# Patient Record
Sex: Male | Born: 1975 | Race: White | Hispanic: No | Marital: Single | Smoking: Never smoker
Health system: Southern US, Community
[De-identification: ages and names within clinical notes are randomized; demographics above are authoritative.]

## PROBLEM LIST (undated history)

## (undated) DIAGNOSIS — K859 Acute pancreatitis without necrosis or infection, unspecified: Secondary | ICD-10-CM

## (undated) DIAGNOSIS — F419 Anxiety disorder, unspecified: Secondary | ICD-10-CM

## (undated) DIAGNOSIS — R5600 Simple febrile convulsions: Secondary | ICD-10-CM

## (undated) DIAGNOSIS — A0472 Enterocolitis due to Clostridium difficile, not specified as recurrent: Secondary | ICD-10-CM

## (undated) HISTORY — PX: TONSILLECTOMY AND ADENOIDECTOMY: SHX28

## (undated) HISTORY — PX: CHOLECYSTECTOMY: SHX55

## (undated) HISTORY — PX: OTHER SURGICAL HISTORY: SHX169

---

## 2014-10-22 ENCOUNTER — Emergency Department (HOSPITAL_COMMUNITY): Payer: BC Managed Care – PPO

## 2014-10-22 ENCOUNTER — Emergency Department (HOSPITAL_COMMUNITY)
Admission: EM | Admit: 2014-10-22 | Discharge: 2014-10-23 | Disposition: A | Discharge status: Home / Self Care | Payer: BC Managed Care – PPO | Attending: Emergency Medicine | Admitting: Emergency Medicine

## 2014-10-22 ENCOUNTER — Encounter (HOSPITAL_COMMUNITY): Payer: Self-pay | Admitting: *Deleted

## 2014-10-22 DIAGNOSIS — F1410 Cocaine abuse, uncomplicated: Secondary | ICD-10-CM | POA: Insufficient documentation

## 2014-10-22 DIAGNOSIS — F419 Anxiety disorder, unspecified: Secondary | ICD-10-CM | POA: Insufficient documentation

## 2014-10-22 DIAGNOSIS — R1010 Upper abdominal pain, unspecified: Secondary | ICD-10-CM | POA: Insufficient documentation

## 2014-10-22 DIAGNOSIS — Z79899 Other long term (current) drug therapy: Secondary | ICD-10-CM | POA: Diagnosis not present

## 2014-10-22 DIAGNOSIS — R1030 Lower abdominal pain, unspecified: Secondary | ICD-10-CM | POA: Diagnosis not present

## 2014-10-22 DIAGNOSIS — R109 Unspecified abdominal pain: Secondary | ICD-10-CM

## 2014-10-22 DIAGNOSIS — R110 Nausea: Secondary | ICD-10-CM | POA: Insufficient documentation

## 2014-10-22 DIAGNOSIS — F329 Major depressive disorder, single episode, unspecified: Secondary | ICD-10-CM

## 2014-10-22 DIAGNOSIS — F32A Depression, unspecified: Secondary | ICD-10-CM

## 2014-10-22 DIAGNOSIS — Z9049 Acquired absence of other specified parts of digestive tract: Secondary | ICD-10-CM | POA: Diagnosis not present

## 2014-10-22 HISTORY — DX: Anxiety disorder, unspecified: F41.9

## 2014-10-22 HISTORY — DX: Simple febrile convulsions: R56.00

## 2014-10-22 LAB — CBC WITH DIFFERENTIAL/PLATELET
BASOS ABS: 0 10*3/uL (ref 0.0–0.1)
BASOS PCT: 1 %
EOS ABS: 0.2 10*3/uL (ref 0.0–0.7)
Eosinophils Relative: 3 %
HCT: 46.4 % (ref 39.0–52.0)
HEMOGLOBIN: 16 g/dL (ref 13.0–17.0)
Lymphocytes Relative: 24 %
Lymphs Abs: 1.4 10*3/uL (ref 0.7–4.0)
MCH: 30.2 pg (ref 26.0–34.0)
MCHC: 34.5 g/dL (ref 30.0–36.0)
MCV: 87.5 fL (ref 78.0–100.0)
MONOS PCT: 10 %
Monocytes Absolute: 0.6 10*3/uL (ref 0.1–1.0)
NEUTROS PCT: 62 %
Neutro Abs: 3.8 10*3/uL (ref 1.7–7.7)
Platelets: 255 10*3/uL (ref 150–400)
RBC: 5.3 MIL/uL (ref 4.22–5.81)
RDW: 13.6 % (ref 11.5–15.5)
WBC: 6 10*3/uL (ref 4.0–10.5)

## 2014-10-22 LAB — URINALYSIS, ROUTINE W REFLEX MICROSCOPIC
Bilirubin Urine: NEGATIVE
Glucose, UA: NEGATIVE mg/dL
Ketones, ur: 40 mg/dL — AB
Leukocytes, UA: NEGATIVE
Nitrite: NEGATIVE
PROTEIN: NEGATIVE mg/dL
Specific Gravity, Urine: <1.005 — ABNORMAL LOW (ref 1.005–1.030)
UROBILINOGEN UA: 0.2 mg/dL (ref 0.0–1.0)
pH: 5.5 (ref 5.0–8.0)

## 2014-10-22 LAB — RAPID URINE DRUG SCREEN, HOSP PERFORMED
AMPHETAMINES: NOT DETECTED
Barbiturates: NOT DETECTED
Benzodiazepines: NOT DETECTED
Cocaine: NOT DETECTED
OPIATES: POSITIVE — AB
Tetrahydrocannabinol: NOT DETECTED

## 2014-10-22 LAB — COMPREHENSIVE METABOLIC PANEL
ALT: 21 U/L (ref 17–63)
AST: 14 U/L — ABNORMAL LOW (ref 15–41)
Albumin: 4.4 g/dL (ref 3.5–5.0)
Alkaline Phosphatase: 81 U/L (ref 38–126)
Anion gap: 9 (ref 5–15)
BUN: 10 mg/dL (ref 6–20)
CHLORIDE: 104 mmol/L (ref 101–111)
CO2: 28 mmol/L (ref 22–32)
Calcium: 9.3 mg/dL (ref 8.9–10.3)
Creatinine, Ser: 0.88 mg/dL (ref 0.61–1.24)
Glucose, Bld: 85 mg/dL (ref 65–99)
POTASSIUM: 4 mmol/L (ref 3.5–5.1)
SODIUM: 141 mmol/L (ref 135–145)
Total Bilirubin: 0.9 mg/dL (ref 0.3–1.2)
Total Protein: 7 g/dL (ref 6.5–8.1)

## 2014-10-22 LAB — URINE MICROSCOPIC-ADD ON

## 2014-10-22 LAB — LACTIC ACID, PLASMA
LACTIC ACID, VENOUS: 0.8 mmol/L (ref 0.5–2.0)
LACTIC ACID, VENOUS: 1 mmol/L (ref 0.5–2.0)

## 2014-10-22 LAB — LIPASE, BLOOD: LIPASE: 34 U/L (ref 22–51)

## 2014-10-22 MED ORDER — IOHEXOL 300 MG/ML  SOLN
100.0000 mL | Freq: Once | INTRAMUSCULAR | Status: AC | PRN
  Administered 2014-10-22: 100 mL via INTRAVENOUS

## 2014-10-22 MED ORDER — IOHEXOL 300 MG/ML  SOLN
25.0000 mL | Freq: Once | INTRAMUSCULAR | Status: AC | PRN
  Administered 2014-10-22: 25 mL via INTRAVENOUS

## 2014-10-22 MED ORDER — SODIUM CHLORIDE 0.9 % IV SOLN
INTRAVENOUS | Status: DC
  Administered 2014-10-22: 19:00:00 via INTRAVENOUS

## 2014-10-22 MED ORDER — PROMETHAZINE HCL 25 MG PO TABS: 25.0000 mg | ORAL_TABLET | Freq: Four times a day (QID) | ORAL | Status: DC | PRN

## 2014-10-22 MED ORDER — ONDANSETRON HCL 4 MG/2ML IJ SOLN
4.0000 mg | INTRAMUSCULAR | Status: DC | PRN
  Administered 2014-10-22: 4 mg via INTRAVENOUS
  Filled 2014-10-22: qty 2

## 2014-10-22 MED ORDER — MORPHINE SULFATE (PF) 4 MG/ML IV SOLN
4.0000 mg | INTRAVENOUS | Status: DC | PRN
  Administered 2014-10-22: 4 mg via INTRAVENOUS
  Filled 2014-10-22: qty 1

## 2014-10-22 NOTE — ED Notes (Signed)
MD at bedside. 
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
## 2014-10-22 NOTE — ED Notes (Signed)
Consent form for medical records signed and to be faxed via Diplomatic Services operational officer.

## 2014-10-22 NOTE — Discharge Instructions (Signed)
°Emergency Department Resource Guide °1) Find a Doctor and Pay Out of Pocket °Although you won't have to find out who is covered by your insurance plan, it is a good idea to ask around and get recommendations. You will then need to call the office and see if the doctor you have chosen will accept you as a new patient and what types of options they offer for patients who are self-pay. Some doctors offer discounts or will set up payment plans for their patients who do not have insurance, but you will need to ask so you aren't surprised when you get to your appointment. ° °2) Contact Your Local Health Department °Not all health departments have doctors that can see patients for sick visits, but many do, so it is worth a call to see if yours does. If you don't know where your local health department is, you can check in your phone book. The CDC also has a tool to help you locate your state's health department, and many state websites also have listings of all of their local health departments. ° °3) Find a Walk-in Clinic °If your illness is not likely to be very severe or complicated, you may want to try a walk in clinic. These are popping up all over the country in pharmacies, drugstores, and shopping centers. They're usually staffed by nurse practitioners or physician assistants that have been trained to treat common illnesses and complaints. They're usually fairly quick and inexpensive. However, if you have serious medical issues or chronic medical problems, these are probably not your best option. ° °No Primary Care Doctor: °- Call Health Connect at  832-8000 - they can help you locate a primary care doctor that  accepts your insurance, provides certain services, etc. °- Physician Referral Service- 1-800-533-3463 ° °Chronic Pain Problems: °Organization         Address  Phone   Notes  °[facility]  (336) 297-2271 Patients need to be referred by their primary care doctor.  ° °Medication  Assistance: °Organization         Address  Phone   Notes  °[place] [facility] [address] °[place], [place] [address] (336) 641-8030 --Must be a resident of [place] °-- Must have NO insurance coverage whatsoever (no Medicaid/ Medicare, etc.) °-- The pt. MUST have a primary care doctor that directs their care regularly and follows them in the community °  °[facility]  (866) 331-1348   °United Way  (888) 892-1162   ° °Agencies that provide inexpensive medical care: °Organization         Address  Phone   Notes  °[facility]  (336) 832-8035   °[facility]    (336) 832-7272   °[facility] [address] °[place], [place] [address] (336) 832-4777   °[facility] [address], °[place] (336) 271-4999   °[facility]    (336) 373-0678   °[facility]    (336) 272-1050   °[facility] ° [address], [place] Phone:  (336) 832-4444, Fax:  (336) 832-4440 Hours of Operation:  9 am - 6 pm, M-F.  Also accepts Medicaid/Medicare and self-pay.  °[facility] ° [address], [place] Phone: (336) 832-3150, Fax: (336) [phone]. Hours of Operation:  8:30 am - 5:30 pm, M-F.  Also accepts Medicaid and self-pay.  °[facility] [place] [address], [place] Phone: (336) 878-6027   °[facility] [address], [place], [place] (336)723-1848, Ext. 123 Mondays & Thursdays: 7-9 AM.  First 15 patients are seen on a first come, first serve basis. °  ° °Medicaid-accepting [place] Providers: ° °Organization         Address  Phone   Notes  °[facility] [address], [place] (336) 641-2100 Also accepts self-pay patients.  °[facility] [address], [place] ° (336) 856-9996   °[facility] [address], [place]  (336) 288-8857   °[facility] [address] [place] [address], [place] (336) 299-7000   °Veita Bland [address], [place]  ° (336) 373-1557 Only accepts [place] Access Medicaid patients after they have their name applied to their card.  ° °Self-Pay (no insurance) in [place]: ° °Organization         Address  Phone   Notes  °Sickle Cell Patients, [facility] [address], [place] (336) 832-1970   °[facility] [address], [place] (336) 832-4400   °[facility] ° [address], [place] (336) 992-4800   °[facility]/Dr. Osei-Bonsu ° [address] [place] [address], [place] or [address], [place] (336) [phone] Phone number for both [place] and [place] locations is the same.  °[facility] [address], [place] (336) 299-0000   °[facility] [address] [place] [address], [place] or [address] (336) 852-7530 °(336) 878-2260   °[facility] [address], [place] (336) 350-1642, phone; (336) 294-5005, fax Sees patients 1st and 3rd Saturday of every month.  Must not qualify for public or private insurance (i.e. Medicaid, Medicare, [place] Health Choice, Veterans' Benefits) • Household income should be no more than 200% of the poverty level •The clinic cannot treat you if you are pregnant or think you are pregnant • Sexually transmitted diseases are not treated at the clinic.  ° ° °Dental Care: °Organization         Address  Phone  Notes  °[place] [facility] [address], [place] (336) 641-6152 Accepts children up to age 21 who are enrolled in Medicaid or [place] Health Choice; pregnant women with a Medicaid card; and children who have applied for Medicaid or [place] Health Choice, but were declined, whose parents can pay a reduced fee at time of service.  °[place]  [facility] [place]  [address], [place] (336) 641-7733 Accepts children up to age 21 who are enrolled in Medicaid or [place] Health Choice; pregnant women with a Medicaid card; and children who have applied for Medicaid or [place] Health Choice, but were declined, whose parents can pay a reduced fee at time of service.  °[facility] ° [address], [place] (336) 641-4533 Patients are seen by appointment only. Walk-ins are not accepted. [facility] will see patients 18 years of age and older. °Monday - Tuesday (8am-5pm) °Most Wednesdays (8:30-5pm) °$30 per visit, cash only  °[facility] ° [address], [place] (336) 641-4533 Patients are seen by appointment only. Walk-ins are not accepted. [facility] will see patients 18 years of age and older. °One   Wednesday Evening (Monthly: Volunteer Based).  $30 per visit, cash only  °[facility]  (919) 537-3737 for adults; Children under age 4, call Graduate Pediatric Dentistry at (919) [phone]. Children aged 4-14, please call (919) [phone] to request a pediatric application. ° Dental services are provided in all areas of dental care including fillings, crowns and bridges, complete and partial dentures, implants, gum treatment, root canals, and extractions. Preventive care is also provided. Treatment is provided to both adults and children. °Patients are selected via a lottery and there is often a waiting list. °  °[facility] [address], °[place] ° (336) 763-8833 [email or web address] °  °[facility] [address], [place], [place] (336)723-1848, Ext. 123 Second and Fourth Thursday of each month, opens at 6:30 AM; Clinic ends at 9 AM.  Patients are seen on a first-come first-served basis, and a limited number are seen during each clinic.  ° °[facility] ° [address], [place], [place] (336) 723-7904    Eligibility Requirements °You must have lived in [place], [place], or [place] counties for at least the last three months. °  You cannot be eligible for state or federal sponsored healthcare insurance, including Veterans Administration, Medicaid, or Medicare. °  You generally cannot be eligible for healthcare insurance through your employer.  °  How to apply: °Eligibility screenings are held every Tuesday and Wednesday afternoon from 1:00 pm until 4:00 pm. You do not need an appointment for the interview!  °[facility] [address], [place], [place] 336-631-2330   °[facility]  336-342-8273   °[place]  336-703-3100   °[facility]  336-570-6415   ° °Behavioral Health Resources in the Community: °Intensive Outpatient Programs °Organization         Address  Phone  Notes  °[facility] [address], [place], [place] 336-878-6098   °[facility] [address], [place], [place] 336-832-9800   °[facility] [address], [place], [place] ° 336-882-2125   °[facility] [address],  °[place], [place] 1-800-853-5163 or 336-641-4981   °Substance Abuse Resources °Organization         Address  Phone  Notes  °[facility]  336-882-2125   °[facility]  336-784-9470   °[facility]  336-285-9073   °[facility]  336-845-3988   °Residential & Outpatient Substance Abuse Program  1-800-659-3381   °Psychological Services °Organization         Address  Phone  Notes  °[facility]  336- 832-9600   °[facility]  336- 378-7881   °[facility] [address], [place] 1-800-853-5163 or 336-641-4981   ° °Mobile Crisis Teams °Organization         Address  Phone  Notes  °[facility], Mobile Crisis Care Unit  1-877-626-1772   °Assertive °[facility] ° [address]  [place], [place] 336-834-9664   °Sharon DeEsch [address] °[place] 336-554-5454   ° °Self-Help/Support Groups °Organization         Address  Phone             Notes  °[facility]oc[facility] of  - variety of support groups  336- 373-1402 Call for more information  °Narcotics Anonymous (NA), [facility] [address], °[place]  2 meetings at this location  ° °  Residential Treatment Programs Organization         Address  Phone  Notes  [facility] [address],    [place]  1-610-960-4540   [facility]  [address] [place] [address], [place], [place] 478-295-6213   [facility] [address] [place], [place] 086-578-4696 Admissions: 8am-3pm M-F  [facility] [address],    [place], [place] 295-284-1324   [facility] [address] [place], [place], [place] 401-027-2536   [facility] [address]  [place], [place] 644-034-7425   [facility] Intensive Outpatient [address], [place], [place] 956-387-5643   [facility] ([facility] [address] [place]  [place], [place] 3-295-188-4166 or 973-396-7491   [facility] ([facility] [address] [place], [place] 323-557-3220 Accepts Medicaid  [facility] [address],  [place] 2-542-706-2376 Substance Abuse/Addiction Treatment   [facility] Organization         Address  Phone  Notes  [facility]  6414724688   Angie Fava, PhD [address] [place], [place]   8587532279 or (272)169-3314   [facility]   [address] [place], [place] 9303339918   [facility] [address], [place], [place] (938) 596-4895 Insurance/Medicaid/sponsorship through [facility] and Families [address]                                    [place], [place] [phone] Therapy/tele-psych/case    [facility] [address], [place] 647-735-4718    Dr. Lolly Mustache  219-467-1832   [facility]  [employer] [facility] Dept. 1) [address] [place] 2) [address], [place] 3)  [address], [place] [phone] (901)[phone]  551-434-6805   [facility] Child Abuse Hotline 754 478 4663 or 340-178-6359 (After Hours)      Take the prescription as directed.  Call your regular medical doctor tomorrow to schedule a follow up appointment within the next 2 days. Call the mental health resources given to you today to schedule a follow up appointment within the next week.   Return to the Emergency Department immediately sooner if worsening.

## 2014-10-22 NOTE — ED Notes (Signed)
Pt and visitor at bedside updated on plan of care, denies any complaints,

## 2014-10-22 NOTE — ED Notes (Signed)
Patient reports lots of health problems lately. Cholecystectomy that lead to pancreatitis and C.Diff. Reports lost 60 pounds  since July. Reports pain in right side under ribs and also anxiety.

## 2014-10-22 NOTE — ED Notes (Signed)
RN spoke with Corrie Dandy at [facility] who advised that pt did not meet inpatient criteria, would fax outpatient resources to [facility] for pt,  Dr Clarene Duke notified,

## 2014-10-22 NOTE — ED Provider Notes (Signed)
CSN: [id number]     Arrival date & time 10/22/14  1737 History   First MD Initiated Contact with Patient 10/22/14 1753     Chief Complaint  Patient presents with  . abd pain; anxiety       HPI  Pt was seen at 1755. Per pt and his family, c/o gradual onset and persistence of constant abd "pain" and nausea for the past 2 months. Pt is s/p cholecystectomy last month at [facility]. Pt states after he was discharged post-surgery, he developed acute pancreatitis and then cdiff diarrhea. Pt states he continues to take vancomycin, but not as prescribed by his PMD (pt is taking once/day vs four times/day). Pt states he changed the dosing of his vancomycin because his diarrhea improved. Pt states he has been "anxious" for the past 2 weeks, with decreased PO intake. Pt states his PMD told him this past week that "it's like I have PTSD after all I've been through" and started him on effexor. The symptoms have been associated with no other complaints. The patient has a significant history of similar symptoms previously, recently being evaluated for this complaint and multiple prior evals for same.  Pt states he was evaluated at [facility] 4 days ago for these complaints and dx anxiety. Denies fevers, no rash, no CP/SOB, no cough, no back pain, no diarrhea, no vomiting. Denies SI, no HI, no hallucinations.     Past Medical History  Diagnosis Date  . Anxiety   . Febrile seizure, simple (HCC)     as child   Past Surgical History  Procedure Laterality Date  . Cholestectomy    . Tonsillectomy and adenoidectomy    . Cholecystectomy      Social History  Substance Use Topics  . Smoking status: Never Smoker   . Smokeless tobacco: None  . Alcohol Use: No    Review of Systems ROS: Statement: All systems negative except as marked or noted in the HPI; Constitutional: Negative for fever and chills. ; ; Eyes: Negative for eye pain, redness and discharge. ; ; ENMT: Negative for ear pain,  hoarseness, nasal congestion, sinus pressure and sore throat. ; ; Cardiovascular: Negative for chest pain, palpitations, diaphoresis, dyspnea and peripheral edema. ; ; Respiratory: Negative for cough, wheezing and stridor. ; ; Gastrointestinal: +nausea, abd pain. Negative for vomiting, diarrhea, blood in stool, hematemesis, jaundice and rectal bleeding. . ; ; Genitourinary: Negative for dysuria, flank pain and hematuria. ; ; Musculoskeletal: Negative for back pain and neck pain. Negative for swelling and trauma.; ; Skin: Negative for pruritus, rash, abrasions, blisters, bruising and skin lesion.; ; Neuro: Negative for headache, lightheadedness and neck stiffness. Negative for weakness, altered level of consciousness , altered mental status, extremity weakness, paresthesias, involuntary movement, seizure and syncope. ; Psych:  +anxiety. No SI, no SA, no HI, no hallucinations.    Allergies  Review of patient's allergies indicates no known allergies.  Home Medications   Prior to Admission medications   Medication Sig Start Date End Date Taking? Authorizing Provider  cholestyramine (QUESTRAN) 4 G packet USE 1 PACKET THREE TIMES DAILY AS NEEDED FOR DIARRHEA 09/20/14   Historical Provider, MD  clonazePAM (KLONOPIN) 1 MG tablet Take 1 mg by mouth 2 (two) times daily. 10/18/14   Historical Provider, MD  [employer] SENNA PLUS 8.6-50 MG tablet Take 2 tablets by mouth daily. 08/28/14   Historical Provider, MD  vancomycin (VANCOCIN) 125 MG capsule Take 125 mg by mouth daily. 09/20/14   Historical Provider,  MD  venlafaxine XR (EFFEXOR-XR) 37.5 MG 24 hr capsule Take 37.5 mg by mouth daily. 10/15/14   Historical Provider, MD   BP 123/90 mmHg  Pulse 85  Temp(Src) 97.8 F (36.6 C) (Oral)  Resp 18  Ht 6\' 1"  (1.854 m)  Wt 175 lb (79.379 kg)  BMI 23.09 kg/m2  SpO2 100% Physical Exam  1755: Physical examination:  Nursing notes reviewed; Vital signs and O2 SAT reviewed;  Constitutional: Well developed, Well nourished, Well  hydrated, In no acute distress; Head:  Normocephalic, atraumatic; Eyes: EOMI, PERRL, No scleral icterus; ENMT: Mouth and pharynx normal, Mucous membranes moist; Neck: Supple, Full range of motion, No lymphadenopathy; Cardiovascular: Regular rate and rhythm, No murmur, rub, or gallop; Respiratory: Breath sounds clear & equal bilaterally, No rales, rhonchi, wheezes.  Speaking full sentences with ease, Normal respiratory effort/excursion; Chest: Nontender, Movement normal; Abdomen: Soft, +upper>lower abd tenderness to palp, no rebound or guarding. Nondistended, Normal bowel sounds; Genitourinary: No CVA tenderness; Extremities: Pulses normal, No tenderness, No edema, No calf edema or asymmetry.; Neuro: AA&Ox3, Major CN grossly intact.  Speech clear. No gross focal motor or sensory deficits in extremities.; Skin: Color normal, Warm, Dry.; Psych:  Anxious.   ED Course  Procedures (including critical care time) Labs Review   Imaging Review  I have personally reviewed and evaluated these images and lab results as part of my medical decision-making.   EKG Interpretation None      MDM  MDM Reviewed: nursing note and vitals Interpretation: labs, x-ray and CT scan      Results for orders placed or performed during the hospital encounter of 10/22/14  Comprehensive metabolic panel  Result Value Ref Range   Sodium 141 135 - 145 mmol/L   Potassium 4.0 3.5 - 5.1 mmol/L   Chloride 104 101 - 111 mmol/L   CO2 28 22 - 32 mmol/L   Glucose, Bld 85 65 - 99 mg/dL   BUN 10 6 - 20 mg/dL   Creatinine, Ser 1.61 0.61 - 1.24 mg/dL   Calcium 9.3 8.9 - 09.6 mg/dL   Total Protein 7.0 6.5 - 8.1 g/dL   Albumin 4.4 3.5 - 5.0 g/dL   AST 14 (L) 15 - 41 U/L   ALT 21 17 - 63 U/L   Alkaline Phosphatase 81 38 - 126 U/L   Total Bilirubin 0.9 0.3 - 1.2 mg/dL   GFR calc non Af Amer >60 >60 mL/min   GFR calc Af Amer >60 >60 mL/min   Anion gap 9 5 - 15  Lipase, blood  Result Value Ref Range   Lipase 34 22 - 51 U/L   Lactic acid, plasma  Result Value Ref Range   Lactic Acid, Venous 0.8 0.5 - 2.0 mmol/L  Lactic acid, plasma  Result Value Ref Range   Lactic Acid, Venous 1.0 0.5 - 2.0 mmol/L  CBC with Differential  Result Value Ref Range   WBC 6.0 4.0 - 10.5 K/uL   RBC 5.30 4.22 - 5.81 MIL/uL   Hemoglobin 16.0 13.0 - 17.0 g/dL   HCT 04.5 40.9 - 81.1 %   MCV 87.5 78.0 - 100.0 fL   MCH 30.2 26.0 - 34.0 pg   MCHC 34.5 30.0 - 36.0 g/dL   RDW 91.4 78.2 - 95.6 %   Platelets 255 150 - 400 K/uL   Neutrophils Relative % 62 %   Neutro Abs 3.8 1.7 - 7.7 K/uL   Lymphocytes Relative 24 %   Lymphs Abs 1.4 0.7 - 4.0  K/uL   Monocytes Relative 10 %   Monocytes Absolute 0.6 0.1 - 1.0 K/uL   Eosinophils Relative 3 %   Eosinophils Absolute 0.2 0.0 - 0.7 K/uL   Basophils Relative 1 %   Basophils Absolute 0.0 0.0 - 0.1 K/uL  Urinalysis, Routine w reflex microscopic  Result Value Ref Range   Color, Urine YELLOW YELLOW   APPearance CLEAR CLEAR   Specific Gravity, Urine <1.005 (L) 1.005 - 1.030   pH 5.5 5.0 - 8.0   Glucose, UA NEGATIVE NEGATIVE mg/dL   Hgb urine dipstick TRACE (A) NEGATIVE   Bilirubin Urine NEGATIVE NEGATIVE   Ketones, ur 40 (A) NEGATIVE mg/dL   Protein, ur NEGATIVE NEGATIVE mg/dL   Urobilinogen, UA 0.2 0.0 - 1.0 mg/dL   Nitrite NEGATIVE NEGATIVE   Leukocytes, UA NEGATIVE NEGATIVE  Urine rapid drug screen (hosp performed)  Result Value Ref Range   Opiates POSITIVE (A) NONE DETECTED   Cocaine NONE DETECTED NONE DETECTED   Benzodiazepines NONE DETECTED NONE DETECTED   Amphetamines NONE DETECTED NONE DETECTED   Tetrahydrocannabinol NONE DETECTED NONE DETECTED   Barbiturates NONE DETECTED NONE DETECTED  Urine microscopic-add on  Result Value Ref Range   Squamous Epithelial / LPF RARE RARE   WBC, UA 0-2 <3 WBC/hpf   RBC / HPF 0-2 <3 RBC/hpf   Bacteria, UA RARE RARE   Dg Chest 2 View 10/22/2014   CLINICAL DATA:  Right-sided subcostal pain. Significant weight loss.  EXAM: CHEST  2 VIEW   COMPARISON:  None.  FINDINGS: The heart size and mediastinal contours are within normal limits. Both lungs are clear. The visualized skeletal structures are unremarkable.  IMPRESSION: No active cardiopulmonary disease.   Electronically Signed   By: Ellery Plunk M.D.   On: 10/22/2014 19:15   Ct Abdomen Pelvis W Contrast 10/22/2014   CLINICAL DATA:  Right abdominal pain  EXAM: CT ABDOMEN AND PELVIS WITH CONTRAST  TECHNIQUE: Multidetector CT imaging of the abdomen and pelvis was performed using the standard protocol following bolus administration of intravenous contrast.  CONTRAST:  25mL OMNIPAQUE IOHEXOL 300 MG/ML SOLN, OMNIPAQUE IOHEXOL 300 MG/ML SOLN  COMPARISON:  None.  FINDINGS: Tiny hypodensities in the liver have a benign appearance  Postcholecystectomy  Pancreas, spleen, adrenal glands, and kidneys are within normal limits.  Normal appendix.  Bladder and prostate are within normal limits.  No vertebral compression deformity.  No free-fluid.  No abnormal retroperitoneal adenopathy.  IMPRESSION: Tiny hypodensities in the liver are statistically benign. If the patient has a history of malignancy or has risk factors for malignancy, consider six-month follow-up liver MRI. Otherwise, no acute intra-abdominal pathology.   Electronically Signed   By: Jolaine Click M.D.   On: 10/22/2014 19:12    2010:  Workup reassuring. Pt has tol PO well while in the ED without N/V. Two of pt's co-workers/friends are now at the bedside and requested to speak to me. Pt gave them permission. They state to me that they are concerned regarding pt's "mental health declining over the past several months" and "do not believe he is safe to go home tonight."  Pt expressly denies SI/HI to me and his ED RN to direct questioning, and pt is not demonstrating active psychosis. Pt's friends are both insistent that pt "just can't go home like this" and are starting to make inquires regarding [facility] executive administration  (titles, location, availability, etc) and "wanting something done." Will obtain TTS consult.   2345:  TTS has completed  eval: states there is no indication for admission at this time, d/c pt with outpt resources. Pt continues to deny SI and would like to go home now. Pt's co-worker has now apologized to me for his earlier behavior and is appreciative for the extensive evaluation of the pt. Dx and testing d/w pt.  Questions answered.  Verb understanding, agreeable to d/c home with outpt f/u.     Samuel Jester, DO 10/26/14 [phone]

## 2014-10-26 ENCOUNTER — Encounter (HOSPITAL_COMMUNITY): Payer: Self-pay | Admitting: *Deleted

## 2014-10-26 ENCOUNTER — Emergency Department (HOSPITAL_COMMUNITY)
Admission: EM | Admit: 2014-10-26 | Discharge: 2014-10-26 | Disposition: A | Discharge status: Home / Self Care | Payer: BC Managed Care – PPO | Attending: Emergency Medicine | Admitting: Emergency Medicine

## 2014-10-26 DIAGNOSIS — F329 Major depressive disorder, single episode, unspecified: Secondary | ICD-10-CM | POA: Diagnosis not present

## 2014-10-26 DIAGNOSIS — F419 Anxiety disorder, unspecified: Secondary | ICD-10-CM | POA: Diagnosis not present

## 2014-10-26 DIAGNOSIS — Z79899 Other long term (current) drug therapy: Secondary | ICD-10-CM | POA: Insufficient documentation

## 2014-10-26 DIAGNOSIS — F32A Depression, unspecified: Secondary | ICD-10-CM
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
## 2014-10-26 NOTE — ED Provider Notes (Signed)
CSN: [id number]     Arrival date & time 10/26/14  1901 History   First MD Initiated Contact with Patient 10/26/14 1941     Chief Complaint  Patient presents with  . Anxiety     (Consider location/radiation/quality/duration/timing/severity/associated sxs/prior Treatment) HPI Comments: Patient is a 39 year old male who presents with complaints of anxiety and depression. He reports multiple things going on in his life that have caused him to feel this way. He recently had a falling out with a significant other and reports a lack of a support system. He also recently underwent gallbladder surgery which she reports has not subsided the abdominal discomfort he is reporting. He moved to the area approximately one year ago and currently works as a Financial planner at [employer]. He was seen here 3 days ago and had extensive workup performed including CT of the abdomen, laboratory studies, and TTS consultation. He reports to me that he feels "lost without a map". He denies any suicidal or homicidal ideation. He denies any auditory or visual hallucinations.  Patient is a 39 y.o. male presenting with anxiety. The history is provided by the patient.  Anxiety This is a new problem. The current episode started more than 1 week ago. The problem occurs constantly. The problem has been gradually worsening. Associated symptoms include abdominal pain. Pertinent negatives include no chest pain. Nothing aggravates the symptoms. Nothing relieves the symptoms. He has tried nothing for the symptoms. The treatment provided no relief.    Past Medical History  Diagnosis Date  . Anxiety   . Febrile seizure, simple (HCC)     as child   Past Surgical History  Procedure Laterality Date  . Cholestectomy    . Tonsillectomy and adenoidectomy    . Cholecystectomy     History reviewed. No pertinent family history. Social History  Substance Use Topics  . Smoking status: Never Smoker   . Smokeless tobacco: None  . Alcohol  Use: No    Review of Systems  Cardiovascular: Negative for chest pain.  Gastrointestinal: Positive for abdominal pain.  All other systems reviewed and are negative.     Allergies  Review of patient's allergies indicates no known allergies.  Home Medications   Prior to Admission medications   Medication Sig Start Date End Date Taking? Authorizing Provider  clonazePAM (KLONOPIN) 1 MG tablet Take 1 mg by mouth 2 (two) times daily. 10/18/14   Historical Provider, MD  promethazine (PHENERGAN) 25 MG tablet Take 1 tablet (25 mg total) by mouth every 6 (six) hours as needed for nausea or vomiting. 10/22/14   Samuel Jester, DO  vancomycin (VANCOCIN) 125 MG capsule Take 125 mg by mouth daily. 09/20/14   Historical Provider, MD  venlafaxine XR (EFFEXOR-XR) 37.5 MG 24 hr capsule Take 37.5 mg by mouth daily. 10/15/14   Historical Provider, MD   BP 145/114 mmHg  Pulse 107  Temp(Src) 98.2 F (36.8 C) (Oral)  Resp 20  Ht  (1.854 m)  Wt 175 lb (79.379 kg)  BMI 23.09 kg/m2  SpO2 97% Physical Exam  Constitutional: He is oriented to person, place, and time. He appears well-developed and well-nourished. No distress.  HENT:  Head: Normocephalic and atraumatic.  Mouth/Throat: Oropharynx is clear and moist.  Eyes: EOM are normal. Pupils are equal, round, and reactive to light.  Neck: Normal range of motion. Neck supple.  Cardiovascular: Normal rate, regular rhythm and normal heart sounds.   No murmur heard. Pulmonary/Chest: Effort normal and breath sounds normal. No respiratory  distress. He has no wheezes.  Abdominal: Soft. Bowel sounds are normal. He exhibits no distension. There is no tenderness.  Musculoskeletal: Normal range of motion. He exhibits no edema.  Lymphadenopathy:    He has no cervical adenopathy.  Neurological: He is alert and oriented to person, place, and time.  Skin: Skin is warm and dry. He is not diaphoretic.  Psychiatric: His speech is normal. Judgment normal. He is  withdrawn. Thought content is not paranoid and not delusional. Cognition and memory are normal. He exhibits a depressed mood. He expresses no homicidal and no suicidal ideation. He expresses no suicidal plans and no homicidal plans.  Nursing note and vitals reviewed.   ED Course  Procedures (including critical care time) Labs Review Labs Reviewed - No data to display  Imaging Review No results found. I have personally reviewed and evaluated these images and lab results as part of my medical decision-making.   EKG Interpretation None      MDM   Final diagnoses:  None    Patient was evaluated by TTS and they do not feel as though he meets inpatient criteria. He will be discharged to home with instructions to follow-up with outpatient counseling. He will be called tomorrow to make this arrangement.    Geoffery Lyons, MD 10/26/14 [phone]

## 2014-10-26 NOTE — ED Notes (Addendum)
Pt reporting generalized abdominal discomfort. Pt states he has a great deal of anxiety and stress.  Reporting feelings of depression, but denies SI/HI.

## 2014-10-26 NOTE — Discharge Instructions (Signed)
TTS will call you to arrange an outpatient counseling appointment.   Major Depressive Disorder Major depressive disorder is a mental illness. It also may be called clinical depression or unipolar depression. Major depressive disorder usually causes feelings of sadness, hopelessness, or helplessness. Some people with this disorder do not feel particularly sad but lose interest in doing things they used to enjoy (anhedonia). Major depressive disorder also can cause physical symptoms. It can interfere with work, school, relationships, and other normal everyday activities. The disorder varies in severity but is longer lasting and more serious than the sadness we all feel from time to time in our lives. Major depressive disorder often is triggered by stressful life events or major life changes. Examples of these triggers include divorce, loss of your job or home, a move, and the death of a family member or close friend. Sometimes this disorder occurs for no obvious reason at all. People who have family members with major depressive disorder or bipolar disorder are at higher risk for developing this disorder, with or without life stressors. Major depressive disorder can occur at any age. It may occur just once in your life (single episode major depressive disorder). It may occur multiple times (recurrent major depressive disorder). SYMPTOMS People with major depressive disorder have either anhedonia or depressed mood on nearly a daily basis for at least 2 weeks or longer. Symptoms of depressed mood include:  Feelings of sadness (blue or down in the dumps) or emptiness.  Feelings of hopelessness or helplessness.  Tearfulness or episodes of crying (may be observed by others).  Irritability (children and adolescents). In addition to depressed mood or anhedonia or both, people with this disorder have at least four of the following symptoms:  Difficulty sleeping or sleeping too much.   Significant change  (increase or decrease) in appetite or weight.   Lack of energy or motivation.  Feelings of guilt and worthlessness.   Difficulty concentrating, remembering, or making decisions.  Unusually slow movement (psychomotor retardation) or restlessness (as observed by others).   Recurrent wishes for death, recurrent thoughts of self-harm (suicide), or a suicide attempt. People with major depressive disorder commonly have persistent negative thoughts about themselves, other people, and the world. People with severe major depressive disorder may experiencedistorted beliefs or perceptions about the world (psychotic delusions). They also may see or hear things that are not real (psychotic hallucinations). DIAGNOSIS Major depressive disorder is diagnosed through an assessment by your health care provider. Your health care provider will ask aboutaspects of your daily life, such as mood,sleep, and appetite, to see if you have the diagnostic symptoms of major depressive disorder. Your health care provider may ask about your medical history and use of alcohol or drugs, including prescription medicines. Your health care provider also may do a physical exam and blood work. This is because certain medical conditions and the use of certain substances can cause major depressive disorder-like symptoms (secondary depression). Your health care provider also may refer you to a mental health specialist for further evaluation and treatment. TREATMENT It is important to recognize the symptoms of major depressive disorder and seek treatment. The following treatments can be prescribed for this disorder:   Medicine. Antidepressant medicines usually are prescribed. Antidepressant medicines are thought to correct chemical imbalances in the brain that are commonly associated with major depressive disorder. Other types of medicine may be added if the symptoms do not respond to antidepressant medicines alone or if psychotic  delusions or hallucinations occur.  Talk therapy.  Talk therapy can be helpful in treating major depressive disorder by providing support, education, and guidance. Certain types of talk therapy also can help with negative thinking (cognitive behavioral therapy) and with relationship issues that trigger this disorder (interpersonal therapy). A mental health specialist can help determine which treatment is best for you. Most people with major depressive disorder do well with a combination of medicine and talk therapy. Treatments involving electrical stimulation of the brain can be used in situations with extremely severe symptoms or when medicine and talk therapy do not work over time. These treatments include electroconvulsive therapy, transcranial magnetic stimulation, and vagal nerve stimulation.   This information is not intended to replace advice given to you by your health care provider. Make sure you discuss any questions you have with your health care provider.   Document Released: 04/29/2012 Document Revised: 01/23/2014 Document Reviewed: 04/29/2012 Elsevier Interactive Patient Education [employer]   Emergency Department Resource Guide 1) Find a Doctor and Pay Out of Pocket Although you won't have to find out who is covered by your insurance plan, it is a good idea to ask around and get recommendations. You will then need to call the office and see if the doctor you have chosen will accept you as a new patient and what types of options they offer for patients who are self-pay. Some doctors offer discounts or will set up payment plans for their patients who do not have insurance, but you will need to ask so you aren't surprised when you get to your appointment.  2) Contact Your Local Health Department Not all health departments have doctors that can see patients for sick visits, but many do, so it is worth a call to see if yours does. If you don't know where your local health department  is, you can check in your phone book. The CDC also has a tool to help you locate your state's health department, and many state websites also have listings of all of their local health departments.  3) Find a Walk-in Clinic If your illness is not likely to be very severe or complicated, you may want to try a walk in clinic. These are popping up all over the country in pharmacies, drugstores, and shopping centers. They're usually staffed by nurse practitioners or physician assistants that have been trained to treat common illnesses and complaints. They're usually fairly quick and inexpensive. However, if you have serious medical issues or chronic medical problems, these are probably not your best option.  No Primary Care Doctor: - Call Health Connect at  [phone] - they can help you locate a primary care doctor that  accepts your insurance, provides certain services, etc. - Physician Referral Service- [phone]  Chronic Pain Problems: Organization         Address  Phone   Notes  [facility]  [phone] Patients need to be referred by their primary care doctor.   Medication Assistance: Organization         Address  Phone   Notes  [facility] Medication [facility] [address] [place] [place], [place] [address] [phone] --Must be a resident of [facility] -- Must have NO insurance coverage whatsoever (no Medicaid/ Medicare, etc.) -- The pt. MUST have a primary care doctor that directs their care regularly and follows them in the community   Med[facility]ist  [phone]   [employer]  [phone]    Agencies that provide inexpensive medical care:  Organization         Address  Phone   Notes  [facility]  [phone]   [facility]    [phone]   [facility] [address] [place], [place] [address] [phone]   [facility] [address] [place], [place]  [phone]   [facility]anned Parenthood    [phone]   [facility]    [phone]   [facility]  [address], [place] Phone:  [phone], Fax:  [phone] Hours of Operation:  9 am - 6 pm, M-F.  Also accepts Medicaid/Medicare and self-pay.  [facility]  [address], [place] Phone: [phone], Fax: [phone]. Hours of Operation:  8:30 am - 5:30 pm, M-F.  Also accepts Medicaid and self-pay.  [facility] [place] [address] [place] Phone: [phone]   [facility] [address] Natasha Bence [place], [place] [phone], Ext. [phone] Mondays & Thursdays: 7-9 AM.  First 15 patients are seen on a first come, first serve basis.    Medicaid-accepting [facility] Providers:  Organization         Address  Phone   Notes  [facility] [address], [place] [phone] Also accepts self-pay patients.  [facility] [address] Laurell Josephs [place], [place]  [phone]   [facility] [address], [place] [phone]   [facility] [address], [place] [phone]   Renaye Rakers [address], [place]   [phone] Only accepts [place] patients after they have their name applied to their card.   Self-Pay (no insurance) in [facility]:  Organization         Address  Phone   Notes  Sickle Cell Patients, [facility] [address] [place], [place] [phone]   [facility] [address] [place], [place] [phone]   [facility] [place]  [address], [place] [phone]   [facility]/Dr. Osei-Bonsu  [address], [place] or [address], [place] [phone] Phone number for both [place] and [place]  locations is the same.  [facility] [address] [place] [phone]   [facility] [address] [place] or [address] [phone]   [facility] [address], [employer] [phone], phone; [phone], fax Sees patients 1st and 3rd Saturday of every month.  Must not qualify for public or private insurance (i.e. Medicaid, Medicare, [place] Health Choice, Veterans' Benefits)  Household income should be no more than 200% of the poverty level The clinic cannot treat you if you are pregnant or think you are pregnant  Sexually transmitted diseases are not treated at the clinic.    Dental Care: Organization         Address  Phone  Notes  [facility] Department of [facility] [address] [place], [place] [phone] Accepts children up to age 70 who are enrolled in [place] or [place] Health Choice; pregnant women with a Medicaid card; and children who have applied for Medicaid or [place] Health Choice, but were declined,  whose parents can pay a reduced fee at time of service.  [facility] Department of [facility]  [address], [place] [phone] Accepts children up to age 29 who are enrolled in [place] or [place] Health Choice; pregnant women with a Medicaid card; and children who have applied for Medicaid or [place] Health Choice, but were declined, whose parents can pay a reduced fee at time of service.  [facility]  [address] [place], [place] [phone] Patients are seen by appointment only. Walk-ins are not accepted. [facility] will see patients 35 years of age and older. Monday - Tuesday (8am-5pm) Most Wednesdays (8:30-5pm) $30 per visit, cash only  [facility] Adult [facility] Access [facility]  [address], [facility] [phone] Patients are seen by appointment only. Walk-ins are not accepted. [facility]  will see patients 84 years of age and older. One Wednesday Evening (Monthly: Volunteer Based).  $30 per visit, cash only  [employer]  [phone] for adults; Children under age 15, call Graduate Pediatric Dentistry at [phone]. Children aged 69-14, please call [phone] to request a pediatric application.  Dental services are provided in all areas of dental care including fillings, crowns and bridges, complete and partial dentures, implants, gum treatment, root canals, and extractions. Preventive care is also provided. Treatment is provided to both adults and children. Patients are selected via a lottery and there is often a waiting list.   [facility] [address], [place]  [phone] [email or web address]   [facility] [address] [place], [place] [phone], Ext. [phone] Second and Fourth Thursday of each month, opens at 6:30 AM; Clinic ends at 9 AM.  Patients are seen on a first-come first-served basis, and a limited number are seen during each clinic.   [facility]  [address] Ether Griffins [place], [place] [phone]   Eligibility Requirements You must have lived in [place], [place], or [place] counties for at least the last three months.   You cannot be eligible for state or federal sponsored [place], including [employer], [place], or [employer].   You generally cannot be eligible for healthcare insurance through your employer.    How to apply: Eligibility screenings are held every Tuesday and Wednesday afternoon from 1:00 pm until 4:00 pm. You do not need an appointment for the interview!  [facility] [address], [place], [place] [phone]   [facility] Health [facility]  [phone]   [facility] Health Department  [phone]   [facility] Health Department  [phone]    Behavioral Health Resources in the Community: Intensive Outpatient  Programs Organization         Address  Phone  Notes  [facility] [address] [place], [place] [phone]   [facility] [address] [place], [place] [phone]   [facility] [address] [place], [place]  [phone]   [facility] [address],  [place], [place] [phone] or [phone]   Substance Abuse Resources Organization         Address  Phone  Notes  [facility]  [phone]   [facility]  [phone]   [facility]  [phone]   Floydene Flock  [phone]   Residential & Outpatient Substance Abuse Program  [phone]   Psychological Services Organization  Address  Phone  Notes  [facility]  [phone]   [facility]  [phone]   [facility] [phone], [place] [phone] or [phone]    Mobile Crisis Teams Organization         Address  Phone  Notes  [facility], Mobile Crisis Care Unit  [phone]   [facility]  [address] [place], [place] [phone]   Doristine Locks [address] [place] [phone]    Self-Help/Support Groups Organization         Address  Phone             Notes  [facility] [place] - variety of support groups  [phone]- [phone] Call for more information  Narcotics Anonymous (NA), [facility] [address], [employer] [place]  2 meetings at this location   Statistician         Address  Phone  Notes  [facility] [address],    [place]  [phone]   [facility]  [address] [place] [address], [place], [place] [phone]   [facility] [address] [place], [place] [phone] Admissions: 8am-3pm M-F  [facility] [address]    [place], [place]  [phone]   [facility] [address] [place], [place], [place] [phone]   [facility] [address],  [place], [place] [phone]   [facility] - Intensive Outpatient [address] Laurell Josephs [address], [place], [place] [phone]   [facility] ([facility]iction [facility] [address] [place].,  [place], [place] [phone] or [phone]   [facility] ([facility]) [address], [place], [place] [phone] Accepts Medicaid  [facility] [address]  [place] [phone] Substance Abuse/Addiction Treatment   [facility] Organization         Address  Phone  Notes  [facility]  [phone]   Angie Fava, PhD [address] Ervin Knack [place], [place]   [phone] or [phone]   [facility]al   [address] [place], [place] [phone]   [facility] [address], [place], [place] [phone] Insurance/Medicaid/sponsorship through [facility] [address]                                    [place], [place] [phone] Therapy/tele-psych/case  [facility] [address], [place] [phone]    Dr. Lolly Mustache  [phone]   [facility]  [employer] [facility] Dept. 1) [address] [place] 2) [address] [place] 3)  [address], [place] [phone]  [phone]   [facility] Child Abuse Hotline ([phone])404-0696 or [phone] (After Hours)

## 2014-10-26 NOTE — [facility] Counselor (Signed)
Disposition: Per Donell Sievert, PA, Pt does not meet criteria for inpt hospitalization. Outpatient care is recommended. Counselor faxed additional resources to [facility] for pt.   Counselor informed Dr Judd Lien of disposition. Dr Judd Lien inquired about how to ensure that the pt follows up with OP care. Counselor told EDP that she will ask in the morning about making an appt at [facility] for the pt in order to ensure follow up.    Cyndie Mull, [facility] Triage Specialist [facility]

## 2017-01-02 ENCOUNTER — Encounter (HOSPITAL_COMMUNITY): Payer: Self-pay | Admitting: Emergency Medicine

## 2017-01-02 ENCOUNTER — Emergency Department (HOSPITAL_COMMUNITY): Payer: BC Managed Care – PPO

## 2017-01-02 ENCOUNTER — Emergency Department (HOSPITAL_COMMUNITY)
Admission: EM | Admit: 2017-01-02 | Discharge: 2017-01-02 | Disposition: A | Discharge status: Home / Self Care | Payer: BC Managed Care – PPO | Attending: Emergency Medicine | Admitting: Emergency Medicine

## 2017-01-02 DIAGNOSIS — R55 Syncope and collapse: Secondary | ICD-10-CM

## 2017-01-02 DIAGNOSIS — I10 Essential (primary) hypertension: Secondary | ICD-10-CM | POA: Insufficient documentation

## 2017-01-02 DIAGNOSIS — R002 Palpitations: Secondary | ICD-10-CM | POA: Diagnosis not present

## 2017-01-02 DIAGNOSIS — R479 Unspecified speech disturbances: Secondary | ICD-10-CM | POA: Diagnosis not present

## 2017-01-02 DIAGNOSIS — R410 Disorientation, unspecified: Secondary | ICD-10-CM | POA: Insufficient documentation

## 2017-01-02 DIAGNOSIS — Z79899 Other long term (current) drug therapy: Secondary | ICD-10-CM | POA: Insufficient documentation

## 2017-01-02 HISTORY — DX: Enterocolitis due to Clostridium difficile, not specified as recurrent: A04.72

## 2017-01-02 HISTORY — DX: Acute pancreatitis without necrosis or infection, unspecified: K85.90

## 2017-01-02 LAB — CBC WITH DIFFERENTIAL/PLATELET
BASOS PCT: 1 %
Basophils Absolute: 0.1 10*3/uL (ref 0.0–0.1)
EOS ABS: 0.1 10*3/uL (ref 0.0–0.7)
Eosinophils Relative: 1 %
HCT: 46.2 % (ref 39.0–52.0)
Hemoglobin: 15.4 g/dL (ref 13.0–17.0)
Lymphocytes Relative: 14 %
Lymphs Abs: 1.2 10*3/uL (ref 0.7–4.0)
MCH: 28.7 pg (ref 26.0–34.0)
MCHC: 33.3 g/dL (ref 30.0–36.0)
MCV: 86 fL (ref 78.0–100.0)
MONO ABS: 0.7 10*3/uL (ref 0.1–1.0)
MONOS PCT: 8 %
Neutro Abs: 6.8 10*3/uL (ref 1.7–7.7)
Neutrophils Relative %: 76 %
PLATELETS: 282 10*3/uL (ref 150–400)
RBC: 5.37 MIL/uL (ref 4.22–5.81)
RDW: 12.6 % (ref 11.5–15.5)
WBC: 8.9 10*3/uL (ref 4.0–10.5)

## 2017-01-02 LAB — BASIC METABOLIC PANEL
Anion gap: 11 (ref 5–15)
BUN: 12 mg/dL (ref 6–20)
CALCIUM: 9.9 mg/dL (ref 8.9–10.3)
CO2: 25 mmol/L (ref 22–32)
CREATININE: 0.88 mg/dL (ref 0.61–1.24)
Chloride: 105 mmol/L (ref 101–111)
Glucose, Bld: 83 mg/dL (ref 65–99)
Potassium: 3.8 mmol/L (ref 3.5–5.1)
SODIUM: 141 mmol/L (ref 135–145)

## 2017-01-02 LAB — TROPONIN I

## 2017-01-02 MED ORDER — SODIUM CHLORIDE 0.9 % IV BOLUS (SEPSIS)
500.0000 mL | Freq: Once | INTRAVENOUS | Status: AC
  Administered 2017-01-02: 500 mL via INTRAVENOUS

## 2017-01-02 NOTE — Discharge Instructions (Signed)
Tests showed no life-threatening condition.  Rest.  Increase fluids.  Follow-up your primary care doctor.

## 2017-01-02 NOTE — ED Provider Notes (Signed)
Patient rechecked prior to discharge.  He is alert and oriented x3 without neurological deficits.  Discussed test results with the patient.  Will discharge and follow-up with primary care.   Donnetta Hutchingook, Sulayman Manning, MD 01/02/17 [phone]

## 2017-01-02 NOTE — ED Provider Notes (Signed)
[facility] Provider Note   CSN: [id number] Arrival date & time: 01/02/17  1402     History   Chief Complaint Chief Complaint  Patient presents with  . Near Syncope    HPI Jeff Mays is a 41 y.o. male.  HPI Patient presents with a near syncopal episode.  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  States that he was confused.  States he is having difficulty speaking.  States he felt "tall".  States he felt his heart going fast.  States they checked his blood pressure and it was high.  States he had one episode like this in the past but it went away on its own.  No chest pain.  No headaches.  States he still feels confused.  He has been doing well the last couple days.  Good appetite.  Good oral intake.  No fevers or chills.  Does have a history of anxiety.   Past Medical History:  Diagnosis Date  . Anxiety   . C. difficile colitis   . Febrile seizure, simple (HCC)    as child  . Pancreatitis     There are no active problems to display for this patient.   Past Surgical History:  Procedure Laterality Date  . CHOLECYSTECTOMY    . cholestectomy    . TONSILLECTOMY AND ADENOIDECTOMY         Home Medications    Prior to Admission medications   Medication Sig Start Date End Date Taking? Authorizing Provider  aspirin 325 MG tablet Take 650 mg by mouth daily as needed for mild pain.    [provider]  cholestyramine light (PREVALITE) 4 G packet Take 4 g by mouth daily as needed (digestive system).    [provider]  clonazePAM (KLONOPIN) 1 MG tablet Take 1 mg by mouth 2 (two) times daily. 10/18/14   [provider]  HYDROcodone-acetaminophen (NORCO) 7.5-325 MG tablet Take 1 tablet by mouth every 6 (six) hours as needed for moderate pain.    [provider]  Multiple Vitamin (MULTIVITAMIN WITH MINERALS) TABS tablet Take 1 tablet by mouth daily.     [provider]  promethazine (PHENERGAN) 25 MG tablet Take 1 tablet (25 mg total) by mouth every 6 (six) hours as needed for nausea or vomiting. Patient not taking: Reported on 10/26/2014 10/22/14   Samuel JesterMcManus, Kathleen, DO  vancomycin (VANCOCIN) 125 MG capsule Take 125 mg by mouth daily. 09/20/14   [provider]  venlafaxine XR (EFFEXOR-XR) 37.5 MG 24 hr capsule Take 37.5 mg by mouth daily. 10/15/14   [provider]    Family History No family history on file.  Social History Social History   Tobacco Use  . Smoking status: Never Smoker  . Smokeless tobacco: Never Used  Substance Use Topics  . Alcohol use: Yes    Frequency: Never    Comment: occasionally   . Drug use: No     Allergies   Patient has no known allergies.   Review of Systems Review of Systems  Constitutional: Negative for appetite change and fever.  HENT: Negative for congestion.   Respiratory: Negative for shortness of breath.   Cardiovascular: Positive for palpitations.  Gastrointestinal: Negative for abdominal pain.  Genitourinary: Negative for flank pain.  Musculoskeletal: Negative for back pain and joint swelling.  Neurological: Positive for speech difficulty and light-headedness.  Hematological: Negative for adenopathy.  Psychiatric/Behavioral: Positive for confusion.     Physical Exam Updated Vital Signs BP (!) 129/97 (BP Location: Right Arm)   Pulse (!) 106   Temp 98.1 F (36.7 C) (Oral)   Resp (!) 23   Ht 6' (1.829 m)   Wt 86.2 kg (190 lb)   SpO2 97%   BMI 25.77 kg/m   Physical Exam  Constitutional: He is oriented to person, place, and time. He appears well-developed.  HENT:  Head: Atraumatic.  Eyes: Pupils are equal, round, and reactive to light.  Neck: Neck supple.  Cardiovascular: Regular rhythm.  Mild tachycardia  Pulmonary/Chest: Effort normal.  Abdominal: There is no tenderness.  Musculoskeletal: He exhibits no edema.  Neurological: He is alert and  oriented to person, place, and time.  Skin: Skin is warm. Capillary refill takes less than 2 seconds.  Psychiatric: He has a normal mood and affect.     ED Treatments / Results  Labs (all labs ordered are listed, but only abnormal results are displayed) Labs Reviewed  TROPONIN I  CBC WITH DIFFERENTIAL/PLATELET  BASIC METABOLIC PANEL    EKG  EKG Interpretation  Date/Time:  Tuesday January 02 2017 14:11:39 EST Ventricular Rate:  105 PR Interval:    QRS Duration: 99 QT Interval:  330 QTC Calculation: 437 R Axis:   47 Text Interpretation:  Sinus tachycardia Confirmed by Benjiman CorePickering, Angela Platner [phone]([id number]) on 01/02/2017 2:55:14 PM       Radiology No results found.  Procedures Procedures (including critical care time)  Medications Ordered in ED Medications - No data to display   Initial Impression / Assessment and Plan / ED Course  I have reviewed the triage vital signs and the nursing notes.  Pertinent labs & imaging results that were available during my care of the patient were reviewed by me and considered in my medical decision making (see chart for details).     Patient with near syncope and confusion.  May have had difficulty speaking.  Mild tachycardia now.  Also has had history of anxiety.  EKG reassuring.  Labs and CT pending.  Care will be turned over to Dr. Adriana Simasook.  Final Clinical Impressions(s) / ED Diagnoses   Final diagnoses:  Near syncope    ED Discharge Orders    None       Benjiman CorePickering, Sharmin Foulk, MD 01/02/17 1500

## 2017-01-02 NOTE — ED Notes (Signed)
Pt returned from radiology.

## 2017-01-02 NOTE — ED Notes (Signed)
Pt reports feeling confused and "unwell." AOx4.

## 2017-01-02 NOTE — ED Notes (Signed)
EDP at bedside updating patient. 

## 2017-01-02 NOTE — ED Notes (Signed)
Pt transported to radiology.

## 2017-01-02 NOTE — ED Triage Notes (Signed)
Per EMS, pt with near syncopal episode about and hour ago. States he was sitting in a chair and then suddenly felt like he was going to pass out. Pt reports recent illness. CBG 177. AO x4.

## 2017-01-02 NOTE — ED Notes (Signed)
Phlebotomy at bedside.

## 2019-03-23 ENCOUNTER — Ambulatory Visit: Payer: BC Managed Care – PPO | Attending: Internal Medicine

## 2019-03-23 DIAGNOSIS — Z23 Encounter for immunization: Secondary | ICD-10-CM | POA: Insufficient documentation

## 2019-03-23 NOTE — Progress Notes (Signed)
   Covid-19 Vaccination Clinic  Name:  Jeff Mays    MRN: [id number] DOB: 06-Jul-1975  03/23/2019  [name] was observed post Covid-19 immunization for 30 minutes based on pre-vaccination screening without incident. He was provided with Vaccine Information Sheet and instruction to access the V-Safe system.   [name] was instructed to call 911 with any severe reactions post vaccine: Marland Kitchen Difficulty breathing  . Swelling of face and throat  . A fast heartbeat  . A bad rash all over body  . Dizziness and weakness   Immunizations Administered    Name Date Dose VIS Date Route   Pfizer COVID-19 Vaccine 03/23/2019  4:35 PM 0.3 mL 12/27/2018 Intramuscular   Manufacturer: [employer], [employer]   Lot: [id number]   NDC: [address]-2

## 2019-03-23 NOTE — Progress Notes (Signed)
Patient experienced localized swelling (dime sized) and bleeding at immunization site immediately after injection. Bandaid and pressure applied to injection site, bleeding stopped. Area of induration/swelling marked with pen. Patient applied ice for 10 minutes.  Swelling resolved. Patient checked out at observation desk. Andree Coss, RN

## 2019-04-13 ENCOUNTER — Ambulatory Visit: Payer: BC Managed Care – PPO | Attending: Internal Medicine

## 2019-04-13 DIAGNOSIS — Z23 Encounter for immunization: Secondary | ICD-10-CM

## 2019-04-13 NOTE — Progress Notes (Signed)
   Covid-19 Vaccination Clinic  Name:  Jeff Mays    MRN: [id number] DOB: 1975/10/03  04/13/2019  [name] was observed post Covid-19 immunization for 15 minutes without incident. He was provided with Vaccine Information Sheet and instruction to access the V-Safe system.   [name] was instructed to call 911 with any severe reactions post vaccine: Marland Kitchen Difficulty breathing  . Swelling of face and throat  . A fast heartbeat  . A bad rash all over body  . Dizziness and weakness   Immunizations Administered    Name Date Dose VIS Date Route   Pfizer COVID-19 Vaccine 04/13/2019  2:23 PM 0.3 mL 12/27/2018 Intramuscular   Manufacturer: [employer], [employer]   Lot: [id number]   NDC: [address]-4

## 2019-08-23 IMAGING — CT CT HEAD W/O CM
3 series · 16 of 47 positions shown, 19 images · non-contrast
Comparison: None.

CLINICAL DATA: Altered level of consciousness.

EXAM:
CT HEAD WITHOUT CONTRAST
TECHNIQUE: Contiguous axial images were obtained from the base of the skull
through the vertex without intravenous contrast.

[Series 2: head wo · axial · 0.46mm/px · z∈[+1476,+1606]mm · 10 of 32 slices shown, 13 images]
[im 3/32  brain]
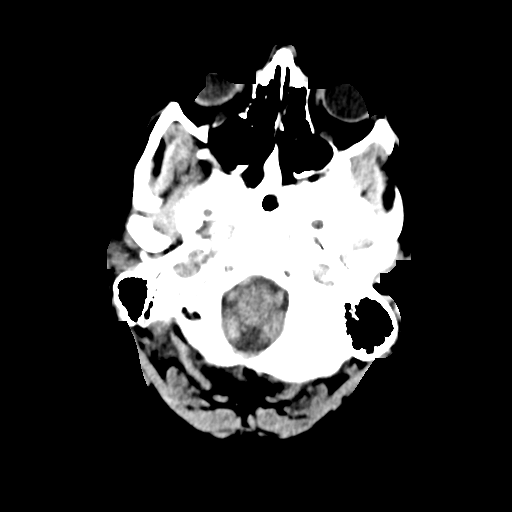
[im 3/32  bone]
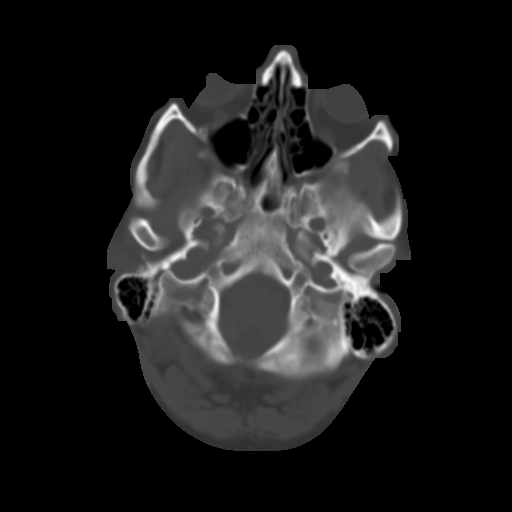
[im 6/32  brain]
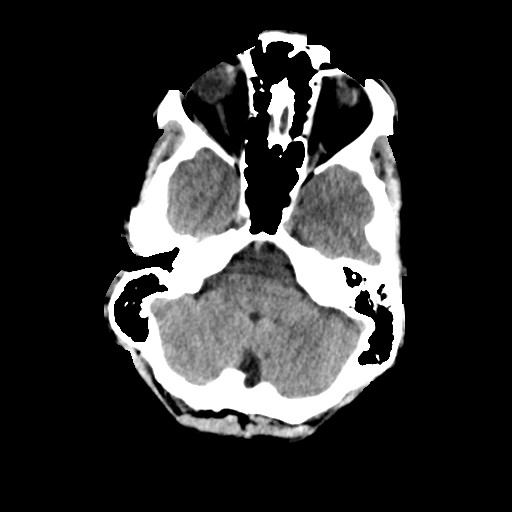
[im 9/32  brain]
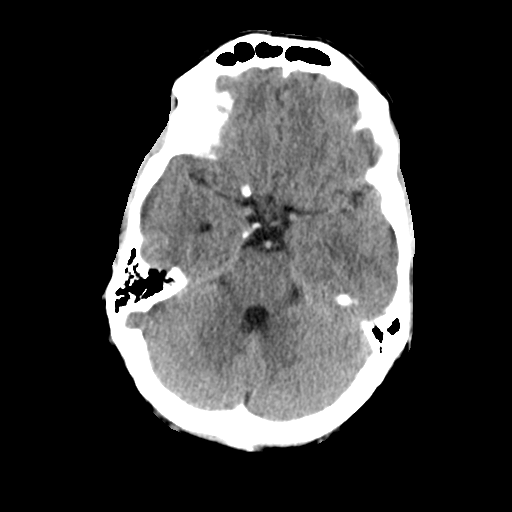
[im 11/32  brain]
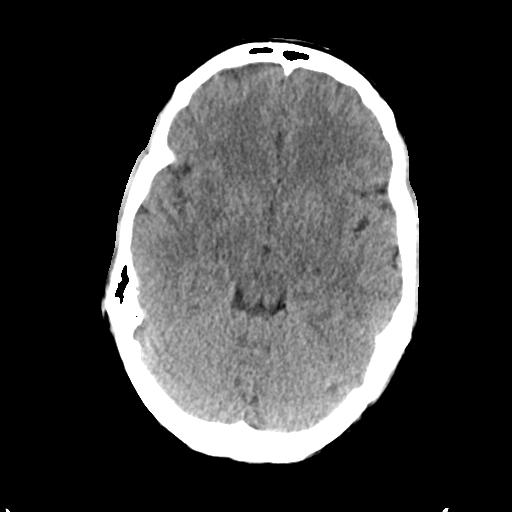
[im 14/32  brain]
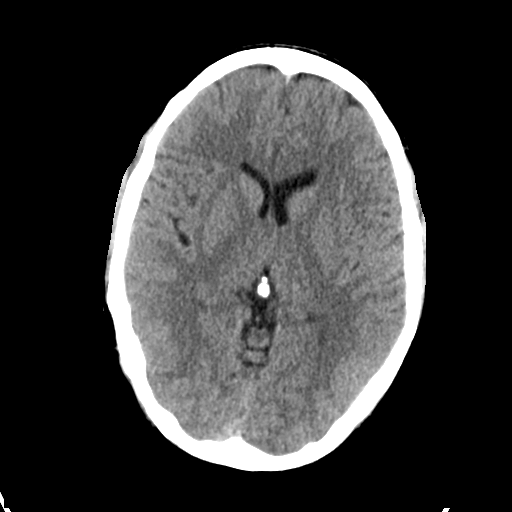
[im 14/32  bone]
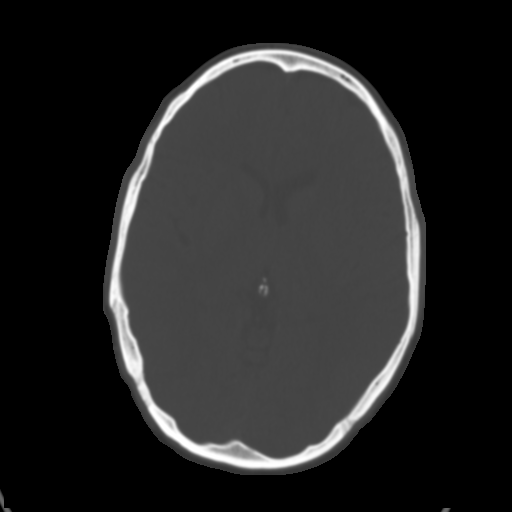
[im 18/32  brain]
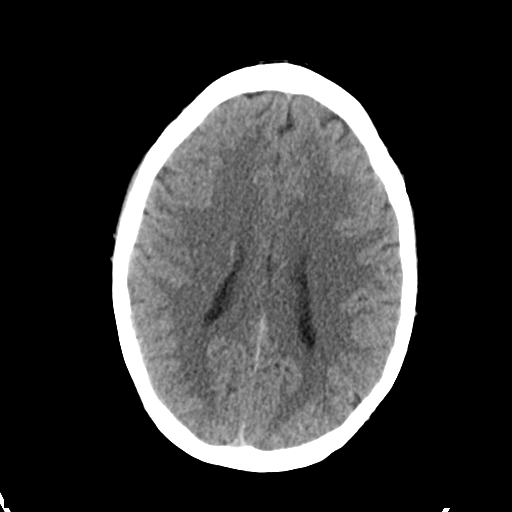
[im 21/32  brain]
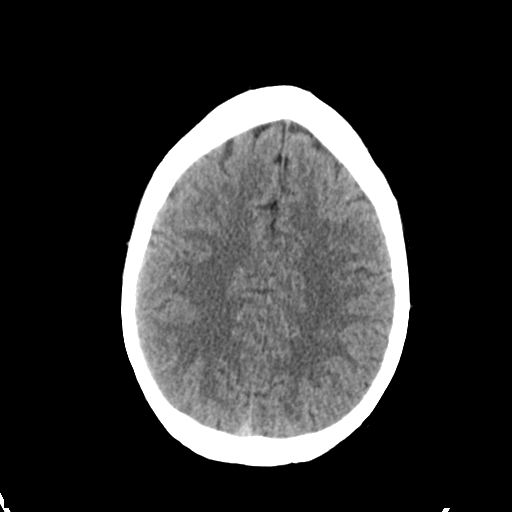
[im 24/32  brain]
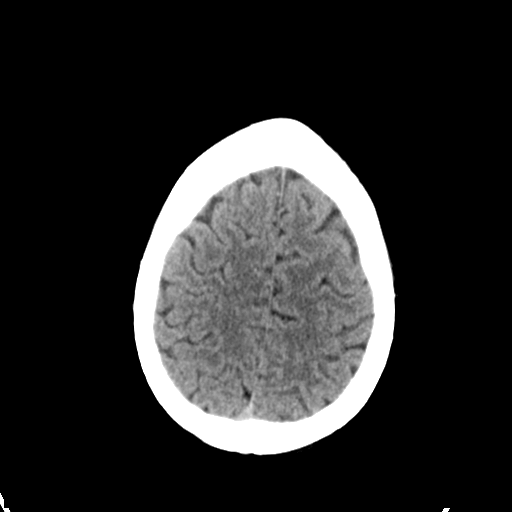
[im 26/32  brain]
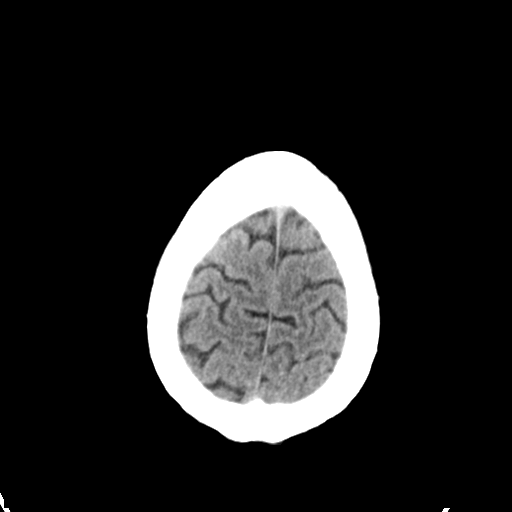
[im 26/32  bone]
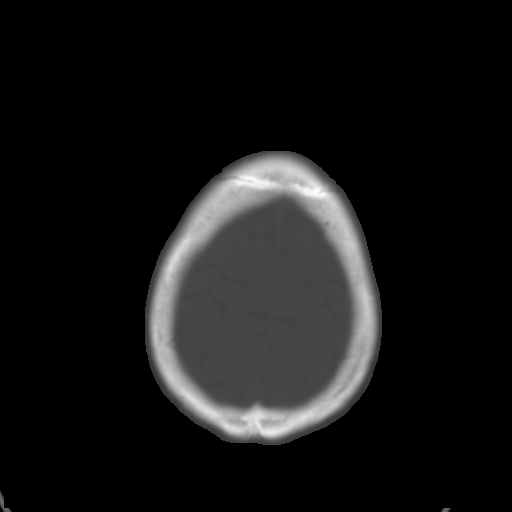
[im 29/32  brain]
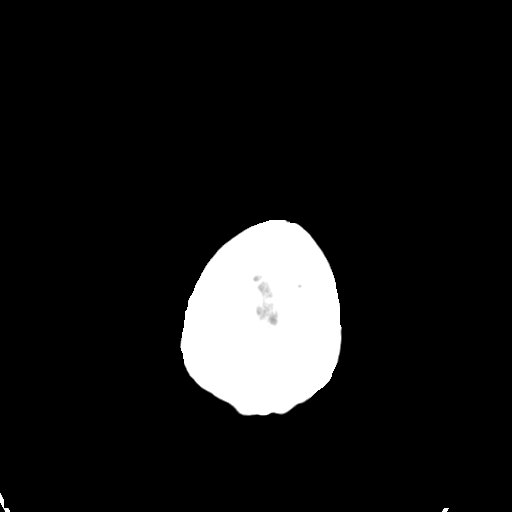

[Series 4: coronal soft tissue · coronal · 0.32mm/px · 3 of 71 slices shown]
[im 24/71  brain]
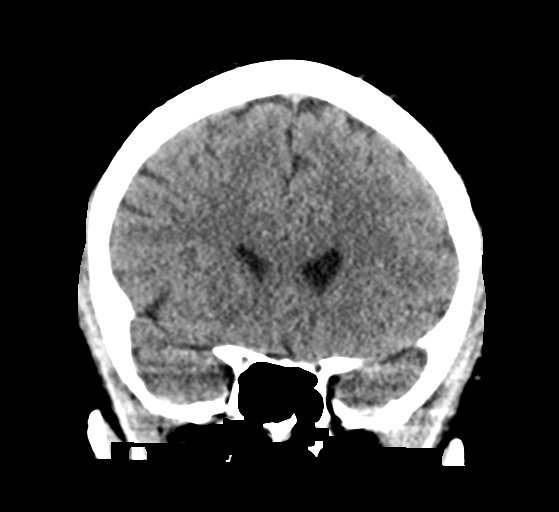
[im 32/71  brain]
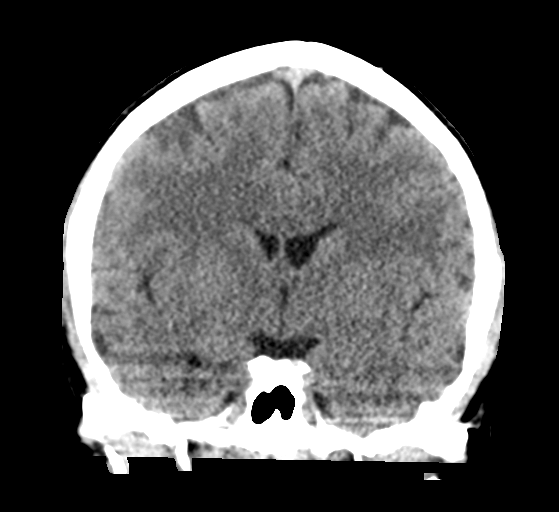
[im 39/71  brain]
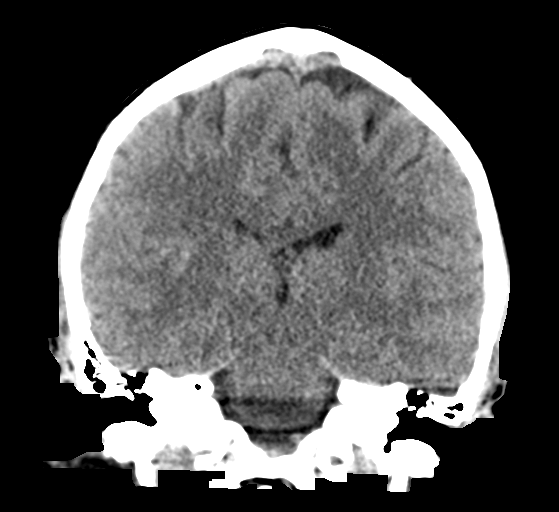

[Series 5: sagittal soft tissue · sagittal · 0.34mm/px · 3 of 55 slices shown]
[im 19/55  brain]
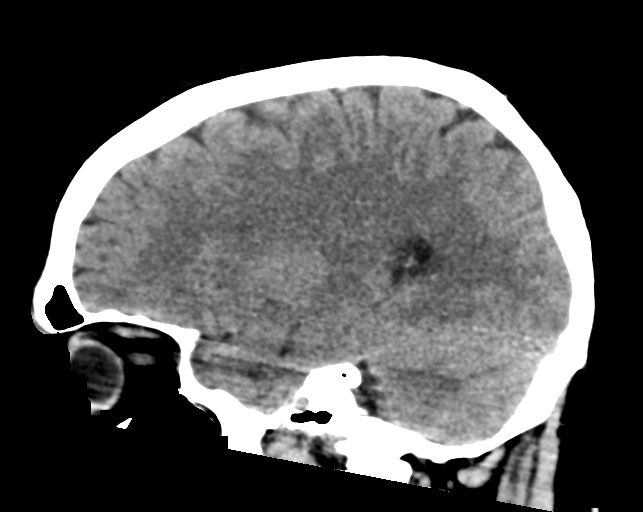
[im 28/55  brain]
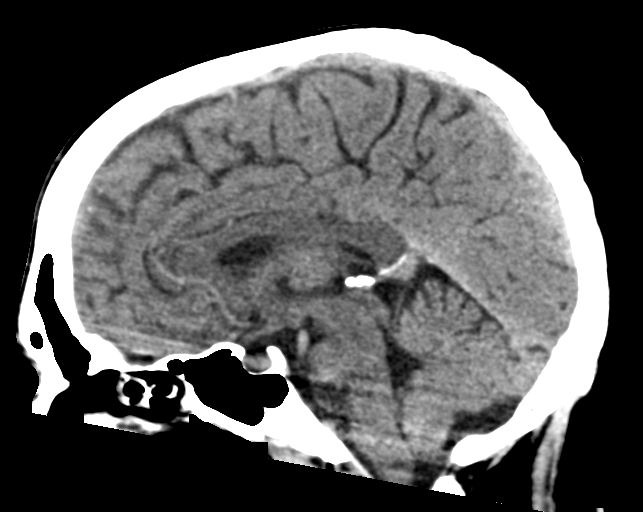
[im 37/55  brain]
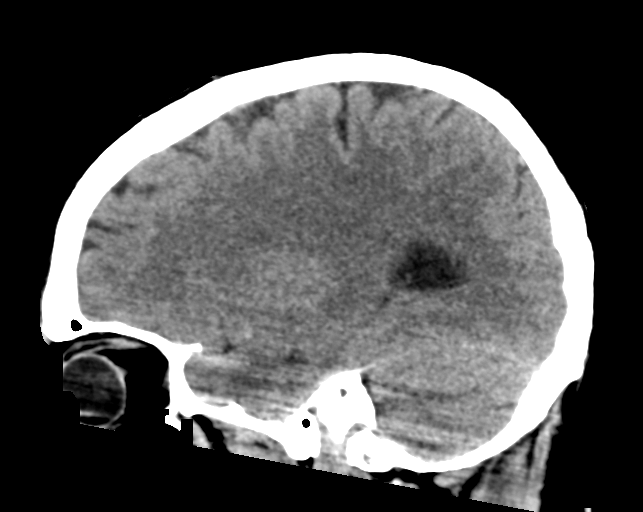

[16 of 47 positions shown; findings below may reference images not displayed]

FINDINGS: Brain: No evidence of acute infarction, hemorrhage, hydrocephalus,
extra-axial collection or mass lesion/mass effect.

Vascular: Negative for hyperdense vessel

Skull: Negative

Sinuses/Orbits: Negative

Other: None
IMPRESSION: Negative CT head
